# Patient Record
Sex: Female | Born: 1975
Health system: Southern US, Community
[De-identification: ages and names within clinical notes are randomized; demographics above are authoritative.]

## PROBLEM LIST (undated history)

## (undated) DIAGNOSIS — J45909 Unspecified asthma, uncomplicated: Secondary | ICD-10-CM

## (undated) DIAGNOSIS — E039 Hypothyroidism, unspecified: Secondary | ICD-10-CM

---

## 2018-09-06 ENCOUNTER — Ambulatory Visit
Admission: RE | Admit: 2018-09-06 | Discharge: 2018-09-06 | Disposition: A | Discharge status: Home / Self Care | Payer: 59 | Source: Ambulatory Visit | Attending: Family Medicine | Admitting: Family Medicine

## 2018-09-06 ENCOUNTER — Other Ambulatory Visit: Payer: Self-pay | Admitting: Family Medicine

## 2018-09-06 DIAGNOSIS — J189 Pneumonia, unspecified organism: Secondary | ICD-10-CM

## 2018-10-10 ENCOUNTER — Emergency Department (HOSPITAL_COMMUNITY): Payer: 59

## 2018-10-10 ENCOUNTER — Encounter (HOSPITAL_COMMUNITY): Payer: Self-pay | Admitting: *Deleted

## 2018-10-10 ENCOUNTER — Emergency Department (HOSPITAL_COMMUNITY)
Admission: EM | Admit: 2018-10-10 | Discharge: 2018-10-10 | Disposition: A | Discharge status: Home / Self Care | Payer: 59 | Attending: Emergency Medicine | Admitting: Emergency Medicine

## 2018-10-10 DIAGNOSIS — J45909 Unspecified asthma, uncomplicated: Secondary | ICD-10-CM | POA: Insufficient documentation

## 2018-10-10 DIAGNOSIS — R519 Headache, unspecified: Secondary | ICD-10-CM

## 2018-10-10 DIAGNOSIS — E039 Hypothyroidism, unspecified: Secondary | ICD-10-CM | POA: Insufficient documentation

## 2018-10-10 DIAGNOSIS — F17210 Nicotine dependence, cigarettes, uncomplicated: Secondary | ICD-10-CM | POA: Diagnosis not present

## 2018-10-10 DIAGNOSIS — R51 Headache: Secondary | ICD-10-CM | POA: Diagnosis not present

## 2018-10-10 HISTORY — DX: Unspecified asthma, uncomplicated: J45.909

## 2018-10-10 HISTORY — DX: Hypothyroidism, unspecified: E03.9

## 2018-10-10 LAB — CBC WITH DIFFERENTIAL/PLATELET
Abs Immature Granulocytes: 0.03 10*3/uL (ref 0.00–0.07)
Basophils Absolute: 0.1 10*3/uL (ref 0.0–0.1)
Basophils Relative: 1 %
Eosinophils Absolute: 0.4 10*3/uL (ref 0.0–0.5)
Eosinophils Relative: 4 %
HEMATOCRIT: 41.4 % (ref 36.0–46.0)
HEMOGLOBIN: 13.6 g/dL (ref 12.0–15.0)
IMMATURE GRANULOCYTES: 0 %
LYMPHS ABS: 1.8 10*3/uL (ref 0.7–4.0)
LYMPHS PCT: 19 %
MCH: 29.4 pg (ref 26.0–34.0)
MCHC: 32.9 g/dL (ref 30.0–36.0)
MCV: 89.6 fL (ref 80.0–100.0)
Monocytes Absolute: 0.4 10*3/uL (ref 0.1–1.0)
Monocytes Relative: 4 %
NEUTROS PCT: 72 %
NRBC: 0 % (ref 0.0–0.2)
Neutro Abs: 6.6 10*3/uL (ref 1.7–7.7)
Platelets: 293 10*3/uL (ref 150–400)
RBC: 4.62 MIL/uL (ref 3.87–5.11)
RDW: 13.1 % (ref 11.5–15.5)
WBC: 9.2 10*3/uL (ref 4.0–10.5)

## 2018-10-10 LAB — COMPREHENSIVE METABOLIC PANEL
ALK PHOS: 55 U/L (ref 38–126)
ALT: 21 U/L (ref 0–44)
ANION GAP: 8 (ref 5–15)
AST: 18 U/L (ref 15–41)
Albumin: 3.7 g/dL (ref 3.5–5.0)
BILIRUBIN TOTAL: 0.9 mg/dL (ref 0.3–1.2)
BUN: 10 mg/dL (ref 6–20)
CO2: 23 mmol/L (ref 22–32)
Calcium: 9 mg/dL (ref 8.9–10.3)
Chloride: 106 mmol/L (ref 98–111)
Creatinine, Ser: 0.8 mg/dL (ref 0.44–1.00)
GFR calc Af Amer: >60 mL/min (ref >60)
GFR calc non Af Amer: >60 mL/min (ref >60)
GLUCOSE: 85 mg/dL (ref 70–99)
POTASSIUM: 3.7 mmol/L (ref 3.5–5.1)
SODIUM: 137 mmol/L (ref 135–145)
TOTAL PROTEIN: 7.2 g/dL (ref 6.5–8.1)

## 2018-10-10 LAB — I-STAT BETA HCG BLOOD, ED (MC, WL, AP ONLY): I-stat hCG, quantitative: <5 m[IU]/mL (ref <5)

## 2018-10-10 MED ORDER — KETOROLAC TROMETHAMINE 15 MG/ML IJ SOLN
15.0000 mg | Freq: Once | INTRAMUSCULAR | Status: AC
  Administered 2018-10-10: 15 mg via INTRAVENOUS
  Filled 2018-10-10: qty 1

## 2018-10-10 MED ORDER — METOCLOPRAMIDE HCL 5 MG/ML IJ SOLN
10.0000 mg | Freq: Once | INTRAMUSCULAR | Status: AC
  Administered 2018-10-10: 10 mg via INTRAVENOUS
  Filled 2018-10-10: qty 2

## 2018-10-10 MED ORDER — IOPAMIDOL (ISOVUE-370) INJECTION 76%
75.0000 mL | Freq: Once | INTRAVENOUS | Status: AC | PRN
  Administered 2018-10-10: 75 mL via INTRAVENOUS

## 2018-10-10 MED ORDER — SODIUM CHLORIDE 0.9 % IV BOLUS
500.0000 mL | Freq: Once | INTRAVENOUS | Status: AC
  Administered 2018-10-10: 500 mL via INTRAVENOUS

## 2018-10-10 MED ORDER — DEXAMETHASONE SODIUM PHOSPHATE 10 MG/ML IJ SOLN
10.0000 mg | Freq: Once | INTRAMUSCULAR | Status: AC
  Administered 2018-10-10: 10 mg via INTRAVENOUS
  Filled 2018-10-10: qty 1

## 2018-10-10 MED ORDER — IOPAMIDOL (ISOVUE-370) INJECTION 76%
INTRAVENOUS | Status: AC
  Filled 2018-10-10: qty 100

## 2018-10-10 NOTE — ED Provider Notes (Signed)
[facility]   CSN: [id number] Arrival date & time: 10/10/18  1357     History   Chief Complaint Chief Complaint  Patient presents with  . Headache    HPI Caroline Murphy is a 42 y.o. female presented for evaluation of headache.  Patient states for the past 3 days, she has been having a persistent headache.  Symptoms are worse with exertion, such as when she goes from sitting to standing or walking to her car.  Headache encompasses her entire head.  When it is very severe, she states she feels like her vision changes.  She has been taking Excedrin Migraine Aleve and Advil without improvement of symptoms.  She reports a history of migraines, but states this feels more severe and different from her normal migraines.  She denies vision changes currently.  She denies fall, trauma, or injury.  She has fevers, chills, chest pain, neck stiffness, cough, shortness of breath, abdominal pain, urinary symptoms, abnormal bowel movements.  She has a history of hypothyroidism and asthma which is controlled with medication.  She is trying to decrease her cigarette use, currently smoking 6-7 cigarettes a day. Denies etoh or drug use.   HPI  Past Medical History:  Diagnosis Date  . Asthma   . Hypothyroid     There are no active problems to display for this patient.   History reviewed. No pertinent surgical history.   OB History   None      Home Medications    Prior to Admission medications   Not on File    Family History History reviewed. No pertinent family history.  Social History Social History   Tobacco Use  . Smoking status: Current Every Day Smoker  . Smokeless tobacco: Never Used  Substance Use Topics  . Alcohol use: Not on file  . Drug use: Not on file     Allergies   Patient has no known allergies.   Review of Systems Review of Systems  Neurological: Positive for headaches.  All other systems reviewed and are  negative.    Physical Exam Updated Vital Signs BP 111/78   Pulse 88   Temp 98.6 F (37 C) (Oral)   Resp 18   LMP 08/10/2018 (Approximate)   SpO2 97%   Physical Exam  Constitutional: She is oriented to person, place, and time. She appears well-developed and well-nourished. No distress.  Young female in NAD  HENT:  Head: Normocephalic and atraumatic.  OP clear without tonsillar swelling or exudate.  Uvula midline with equal palate rise.  TMs nonerythematous and nonbulging bilaterally.  Eyes: Pupils are equal, round, and reactive to light. Conjunctivae and EOM are normal.  EOMI and PERRLA.  No nystagmus.  Neck: Normal range of motion. Neck supple.  Cardiovascular: Normal rate, regular rhythm and intact distal pulses.  Pulmonary/Chest: Effort normal and breath sounds normal. No respiratory distress. She has no wheezes.  Abdominal: Soft. She exhibits no distension and no mass. There is no tenderness. There is no guarding.  Musculoskeletal: Normal range of motion.  Neurological: She is alert and oriented to person, place, and time. No cranial nerve deficit or sensory deficit. Coordination normal.  No obvious neurologic deficits.  CN intact.  Fine movement and coordination intact.  Strength intact x4.  Sensation intact x4.  Skin: Skin is warm and dry. Capillary refill takes less than 2 seconds.  Psychiatric: She has a normal mood and affect.  Nursing note and vitals reviewed.  ED Treatments / Results  Labs (all labs ordered are listed, but only abnormal results are displayed) Labs Reviewed  CBC WITH DIFFERENTIAL/PLATELET  COMPREHENSIVE METABOLIC PANEL  I-STAT BETA HCG BLOOD, ED ([facility], [facility], [facility] ONLY)    EKG None  Radiology Ct Angio Head W/cm &/or Wo Cm  Result Date: 10/10/2018 CLINICAL DATA:  42 y/o  F; worst headache of life for days. EXAM: CT ANGIOGRAPHY HEAD TECHNIQUE: Multidetector CT imaging of the head was performed using the standard protocol during bolus  administration of intravenous contrast. Multiplanar CT image reconstructions and MIPs were obtained to evaluate the vascular anatomy. CONTRAST:  75mL ISOVUE-370 IOPAMIDOL (ISOVUE-370) INJECTION 76% COMPARISON:  10/10/2018 CT head. FINDINGS: CT HEAD Brain: No evidence of acute infarction, hemorrhage, hydrocephalus, extra-axial collection or mass lesion/mass effect. Vascular: As below. Skull: Normal. Negative for fracture or focal lesion. Sinuses: Right anterior nasal passage 17 mm polyp. Right maxillary sinus small mucous retention cyst. Mucosal thickening of anterior ethmoid air cells. Normal aeration of mastoid air cells. Orbits: No acute finding. CTA HEAD Anterior circulation: No significant stenosis, proximal occlusion, aneurysm, or vascular malformation. Posterior circulation: No significant stenosis, proximal occlusion, aneurysm, or vascular malformation. Venous sinuses: As permitted by contrast timing, patent. Anatomic variants: Anterior and right posterior communicating arteries. Delayed phase: No abnormal intracranial enhancement. IMPRESSION: 1. No acute intracranial abnormality or abnormal enhancement of the brain. 2. No significant stenosis, proximal occlusion, aneurysm, or vascular malformation of the Circle of Willis. 3. Right anterior nasal passage polyp. Electronically Signed   By: Mitzi Hansen M.D.   On: 10/10/2018 18:18   Ct Head Wo Contrast  Result Date: 10/10/2018 CLINICAL DATA:  Headache EXAM: CT HEAD WITHOUT CONTRAST TECHNIQUE: Contiguous axial images were obtained from the base of the skull through the vertex without intravenous contrast. COMPARISON:  None. FINDINGS: Brain: No evidence of acute infarction, hemorrhage, hydrocephalus, extra-axial collection or mass lesion/mass effect. Vascular: No hyperdense vessel or unexpected calcification. Skull: Normal. Negative for fracture or focal lesion. Sinuses/Orbits: Retention cyst in the right maxillary sinus. Mucosal thickening in  the ethmoid sinuses. Other: None IMPRESSION: Negative non contrasted CT appearance of the brain. Electronically Signed   By: Jasmine Pang M.D.   On: 10/10/2018 15:35    Procedures Procedures (including critical care time)  Medications Ordered in ED Medications  iopamidol (ISOVUE-370) 76 % injection (has no administration in time range)  ketorolac (TORADOL) 15 MG/ML injection 15 mg (15 mg Intravenous Given 10/10/18 1623)  metoCLOPramide (REGLAN) injection 10 mg (10 mg Intravenous Given 10/10/18 1623)  dexamethasone (DECADRON) injection 10 mg (10 mg Intravenous Given 10/10/18 1622)  sodium chloride 0.9 % bolus 500 mL (0 mLs Intravenous Stopped 10/10/18 1730)  iopamidol (ISOVUE-370) 76 % injection 75 mL (75 mLs Intravenous Contrast Given 10/10/18 1734)     Initial Impression / Assessment and Plan / ED Course  I have reviewed the triage vital signs and the nursing notes.  Pertinent labs & imaging results that were available during my care of the patient were reviewed by me and considered in my medical decision making (see chart for details).     Presenting for evaluation of headache.  Initial exam reassuring, no obvious neurologic deficits.  However, history concerning, symptoms are worsened with exertion.  CT head negative for acute bleed.  Will obtain labs and CTA head.  Headache cocktail given.  Labs reassuring.  Patient without fevers or chills.  No leukocytosis.  No neck stiffness.  Doubt meningitis.  Symptoms wax and wane, doubt CVA,  ICH, SAH. CTA head negative.  Assessment, patient reports headache is completely resolved.  Patient ambulated about return of headache or worsening symptoms.  Remains neurologically stable.  Discussed with attending, Dr. Ranae Palms agrees to plan. Discussed findings with patient.  Discussed continued treatment with Tylenol and ibuprofen as needed.  Follow-up with PCP as needed for further evaluation of her headaches.  At this time, patient appears safe for  discharge.  Return precautions given.  Patient states she understands and agrees to plan.   Final Clinical Impressions(s) / ED Diagnoses   Final diagnoses:  Acute nonintractable headache, unspecified headache type    ED Discharge Orders    None       Alveria Apley, PA-C 10/10/18 2004    Loren Racer, MD 10/10/18 2309

## 2018-10-10 NOTE — Discharge Instructions (Addendum)
Continue taking home medications as prescribed. Make sure you stay well-hydrated water. Use Tylenol or ibuprofen as needed for further headaches. Return to the emergency room if you develop persistent headache despite medication, vision change, slurred speech, numbness/tingling of the arms/legs, or any new or concerning symptoms.

## 2018-10-10 NOTE — ED Notes (Signed)
Vital signs charted in error

## 2018-10-10 NOTE — ED Notes (Signed)
Patient transported to CT 

## 2018-10-10 NOTE — ED Notes (Signed)
Patient verbalizes understanding of discharge instructions. Opportunity for questioning and answers were provided. Armband removed by staff, pt discharged from ED.  

## 2018-10-10 NOTE — ED Triage Notes (Signed)
Pt in c/o headache that is worse with activity and exertion for the last two days, states this is the worst headache of her life and feels like there is a pulsation inside of her head, history of migraines but states this does not feel the same

## 2019-10-27 IMAGING — CT CT ANGIO HEAD
3 of 11 series · 18 of 47 positions shown · IV contrast (APPLIED)
Comparison: 10/10/2018 CT head.

CLINICAL DATA: 42 y/o  F; worst headache of life for days.

EXAM:
CT ANGIOGRAPHY HEAD
TECHNIQUE: Multidetector CT imaging of the head was performed using the
standard protocol during bolus administration of intravenous
contrast. Multiplanar CT image reconstructions and MIPs were
obtained to evaluate the vascular anatomy.
CONTRAST:  75mL ZIUOKU-C14 IOPAMIDOL (ZIUOKU-C14) INJECTION 76%

[Series 9: thin axial · axial · 0.43mm/px · z∈[-221,-84]mm · 12 of 163 slices shown]
[im 13/163  brain]
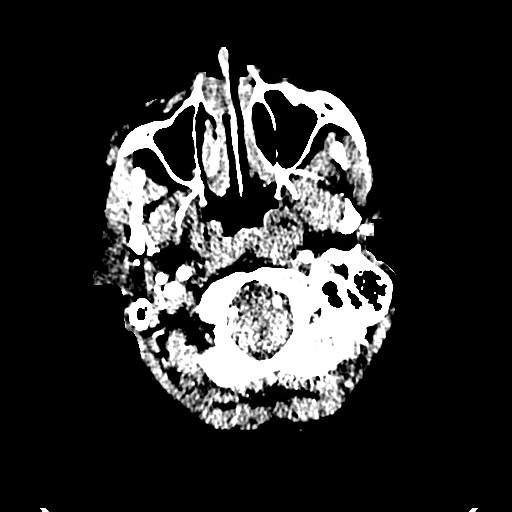
[im 25/163  bone]
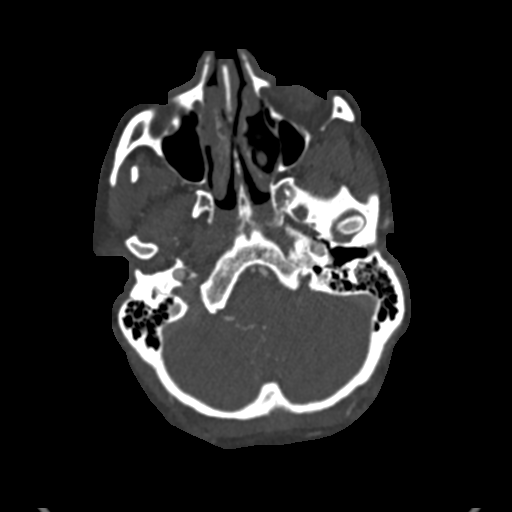
[im 38/163  brain]
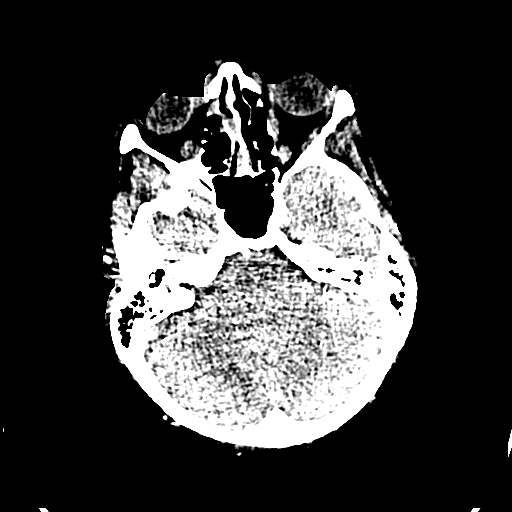
[im 50/163  bone]
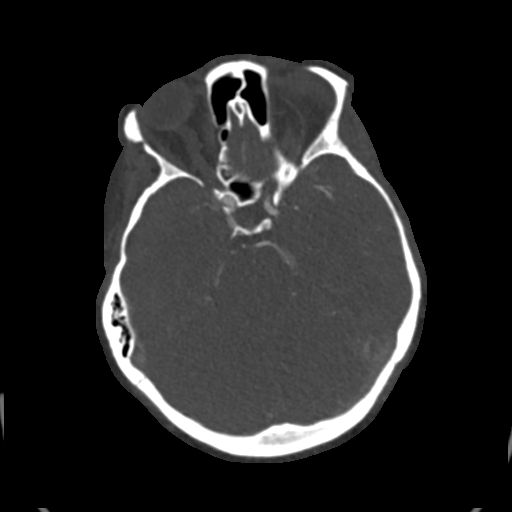
[im 63/163  brain]
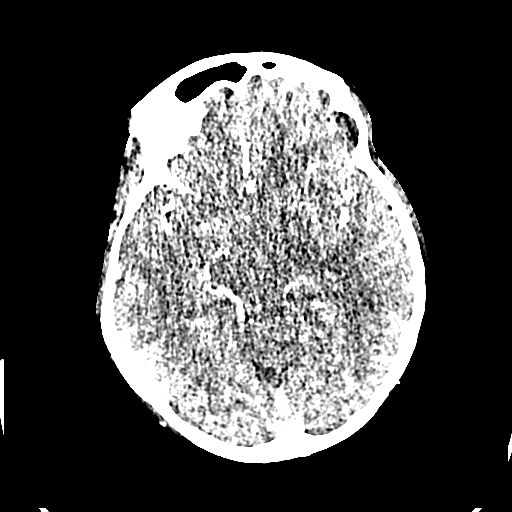
[im 75/163  bone]
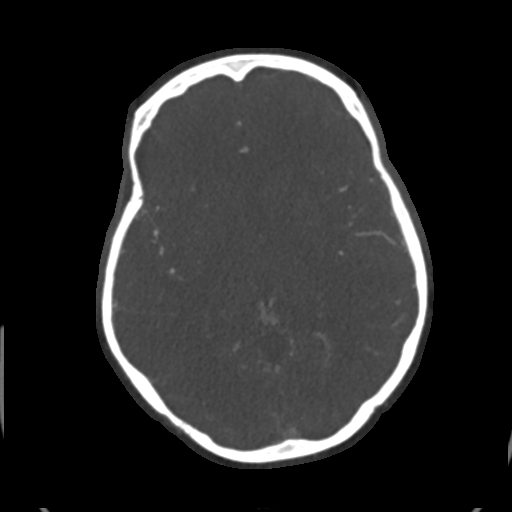
[im 88/163  brain]
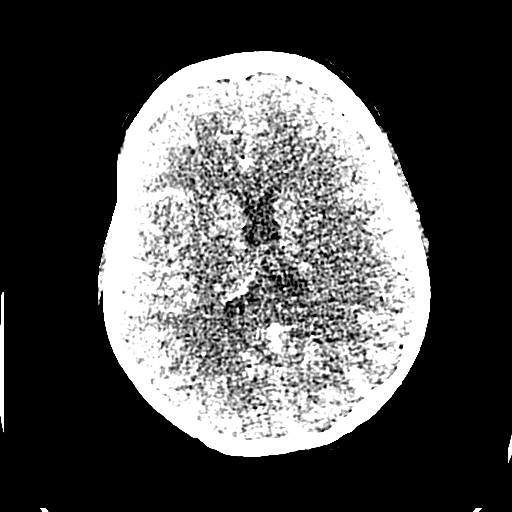
[im 100/163  bone]
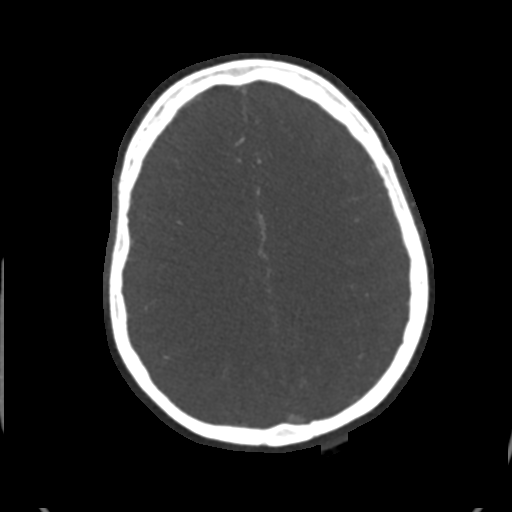
[im 113/163  brain]
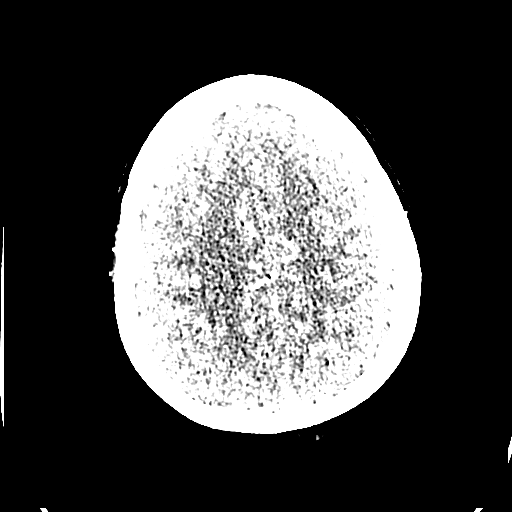
[im 125/163  bone]
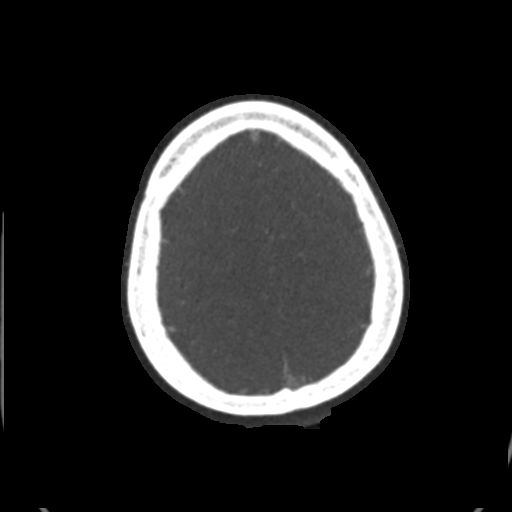
[im 138/163  brain]
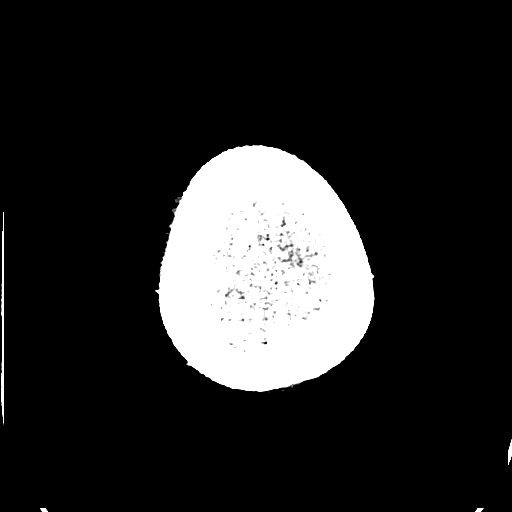
[im 150/163  bone]
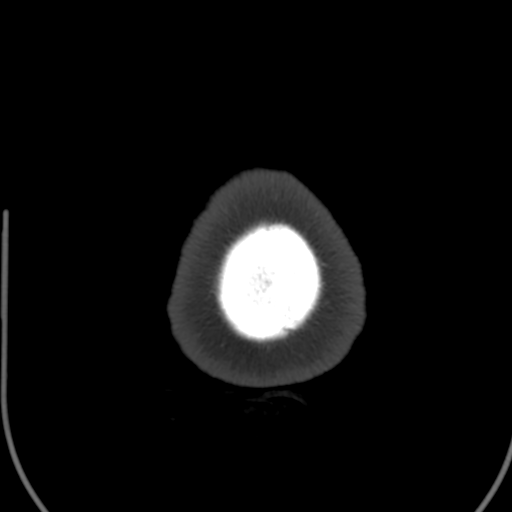

[Series 10: coronal thin · coronal · 0.33mm/px · 3 of 214 slices shown]
[im 61/214  brain]
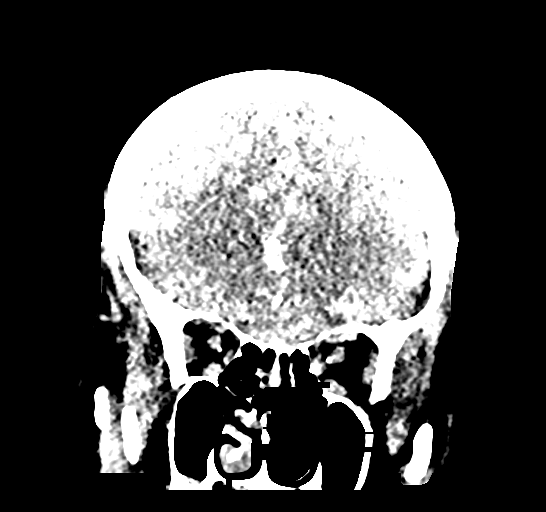
[im 92/214  brain]
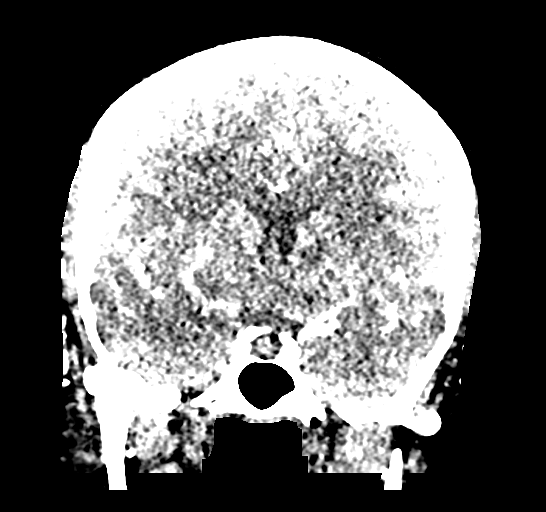
[im 122/214  brain]
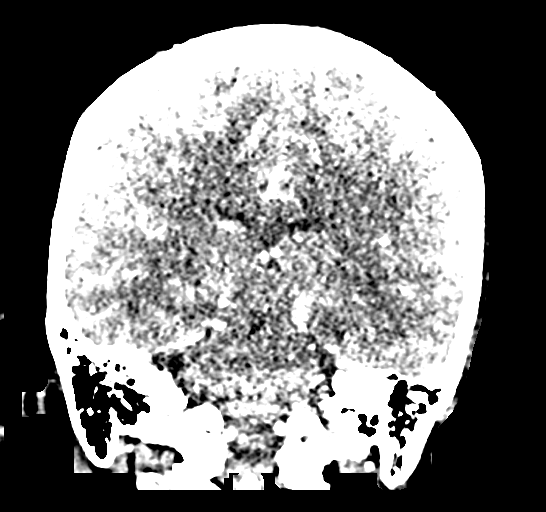

[Series 12: sagittal thin · sagittal · 0.33mm/px · 3 of 173 slices shown]
[im 35/173  brain]
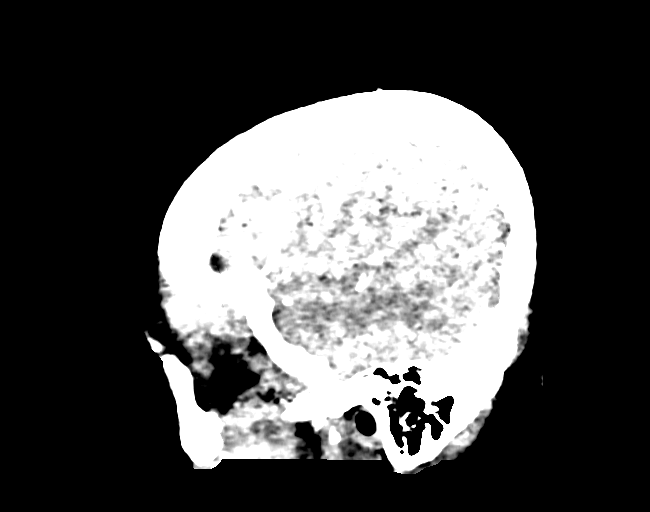
[im 69/173  brain]
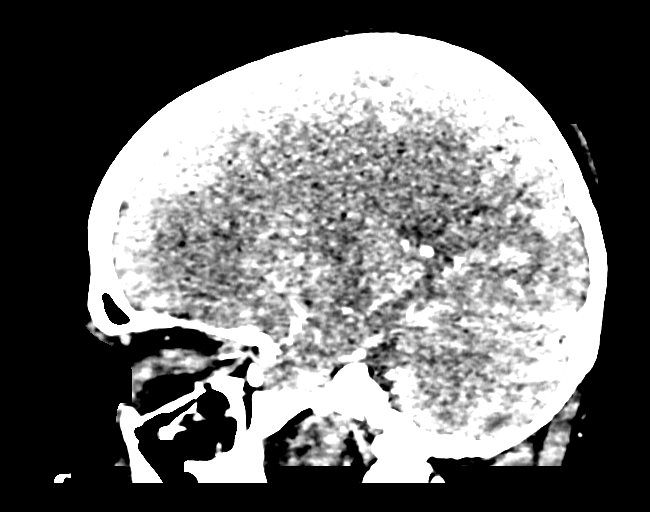
[im 104/173  brain]
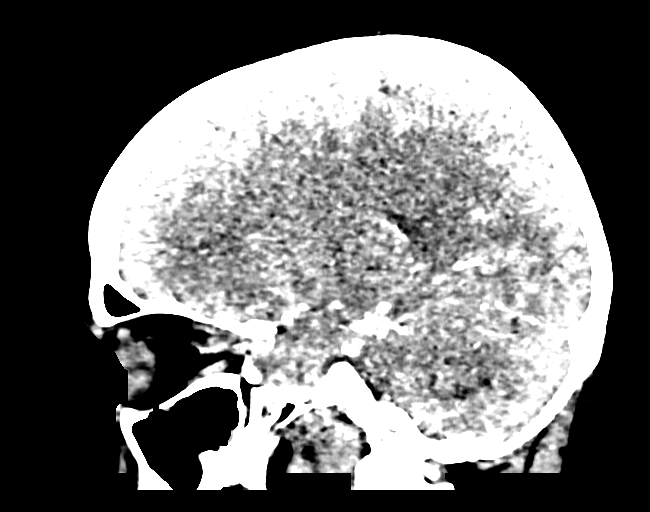

[18 of 47 positions shown; findings below may reference images not displayed]

FINDINGS: CT HEAD

Brain: No evidence of acute infarction, hemorrhage, hydrocephalus,
extra-axial collection or mass lesion/mass effect.

Vascular: As below.

Skull: Normal. Negative for fracture or focal lesion.

Sinuses: Right anterior nasal passage 17 mm polyp. Right maxillary
sinus small mucous retention cyst. Mucosal thickening of anterior
ethmoid air cells. Normal aeration of mastoid air cells.

Orbits: No acute finding.

CTA HEAD

Anterior circulation: No significant stenosis, proximal occlusion,
aneurysm, or vascular malformation.

Posterior circulation: No significant stenosis, proximal occlusion,
aneurysm, or vascular malformation.

Venous sinuses: As permitted by contrast timing, patent.

Anatomic variants: Anterior and right posterior communicating
arteries.

Delayed phase: No abnormal intracranial enhancement.
IMPRESSION: 1. No acute intracranial abnormality or abnormal enhancement of the
brain.
2. No significant stenosis, proximal occlusion, aneurysm, or
vascular malformation of the Circle of Willis.
3. Right anterior nasal passage polyp.

## 2019-10-27 IMAGING — CT CT HEAD W/O CM
4 series · 17 of 47 positions shown, 19 images · non-contrast
Comparison: None.

CLINICAL DATA: Headache

EXAM:
CT HEAD WITHOUT CONTRAST
TECHNIQUE: Contiguous axial images were obtained from the base of the skull
through the vertex without intravenous contrast.

[Series 3: head without · axial · non-contrast · 0.43mm/px · z∈[-105,+20]mm · 7 of 35 slices shown, 9 images]
[im 5/35  brain]
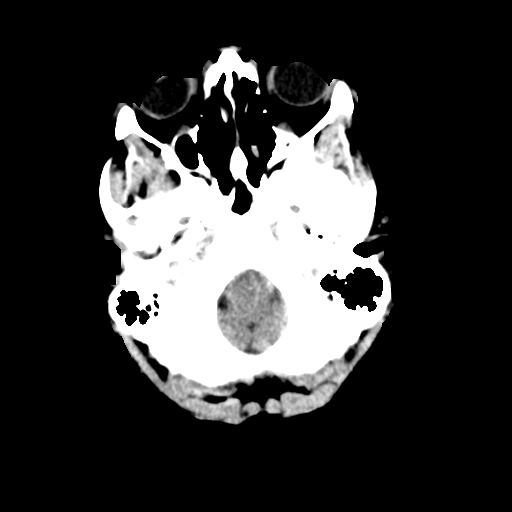
[im 5/35  bone]
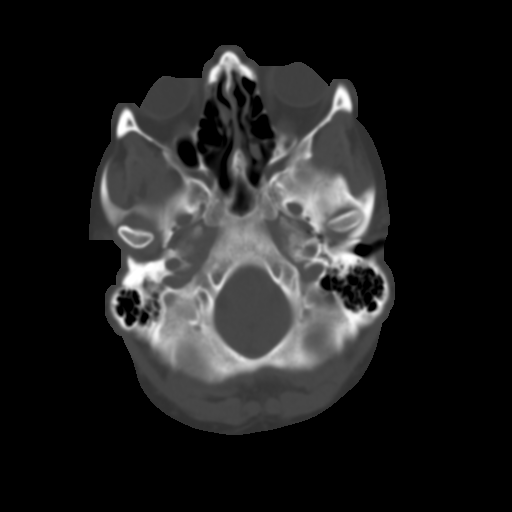
[im 9/35  brain]
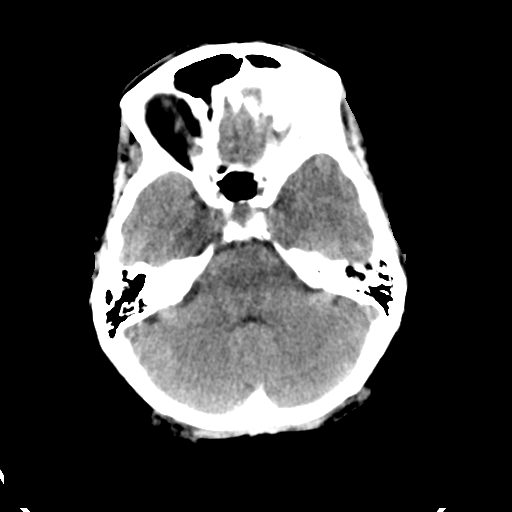
[im 13/35  brain]
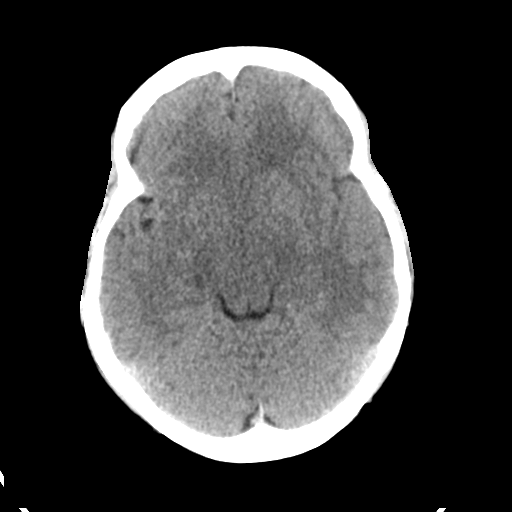
[im 18/35  brain]
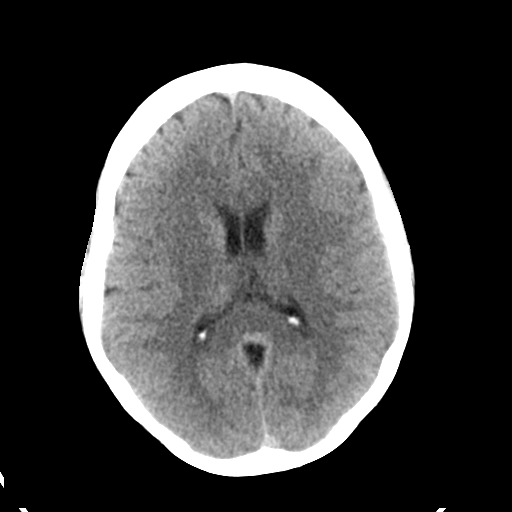
[im 22/35  brain]
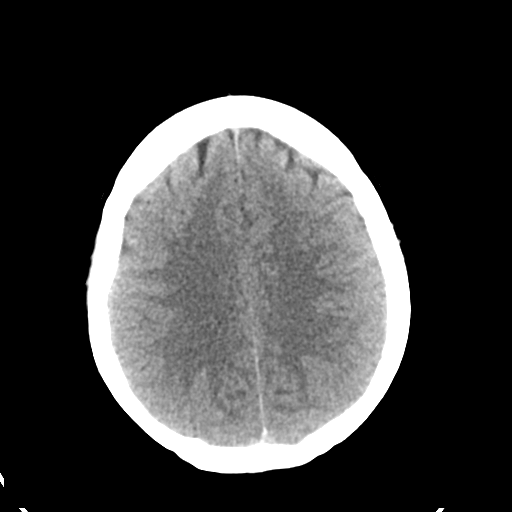
[im 22/35  bone]
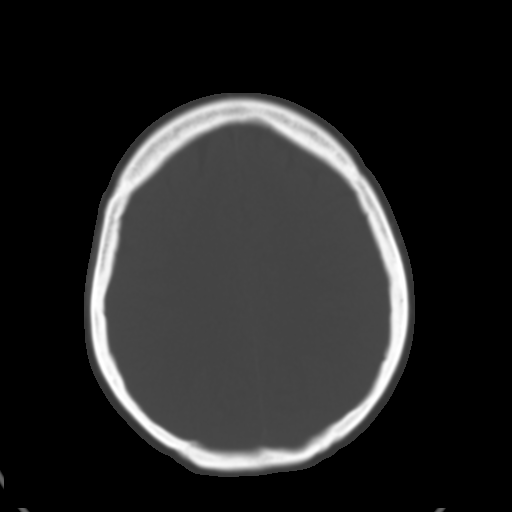
[im 26/35  brain]
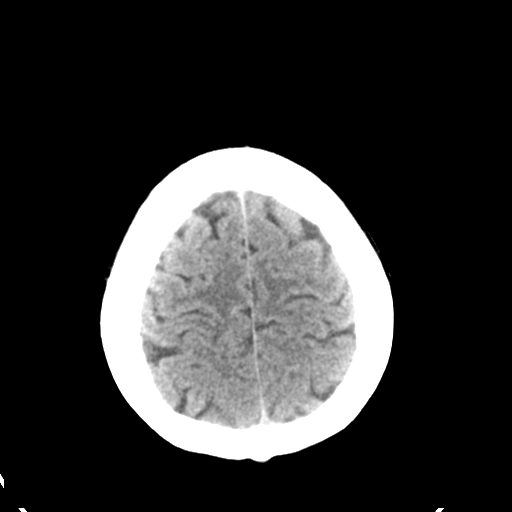
[im 30/35  brain]
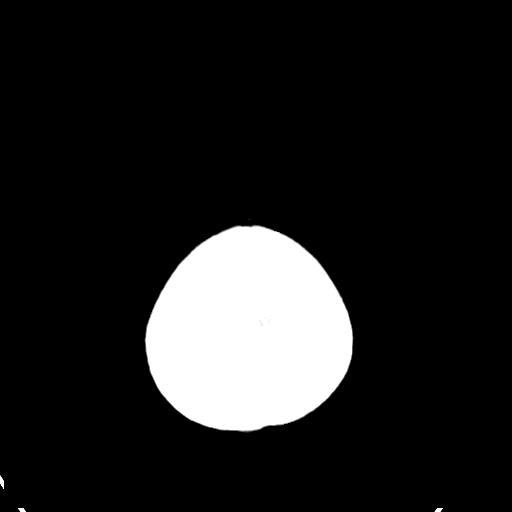

[Series 4: head bone · axial · 0.43mm/px · z∈[-109,-49]mm · 4 of 87 slices shown]
[im 9/87  bone]
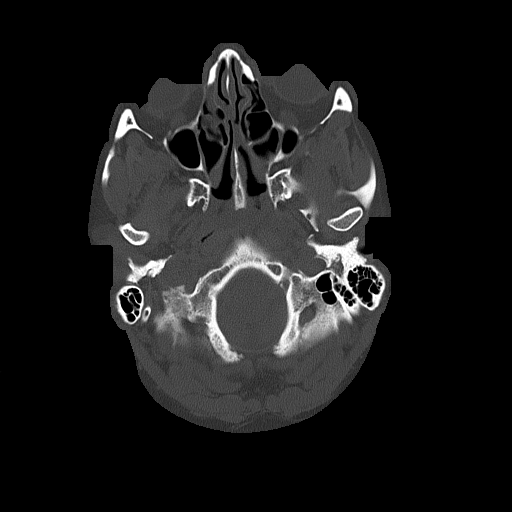
[im 18/87  bone]
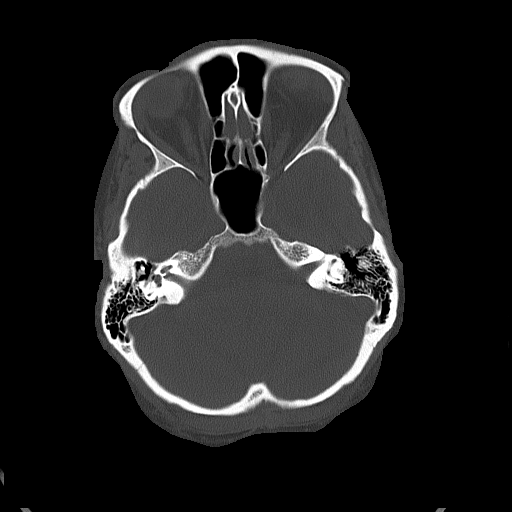
[im 26/87  bone]
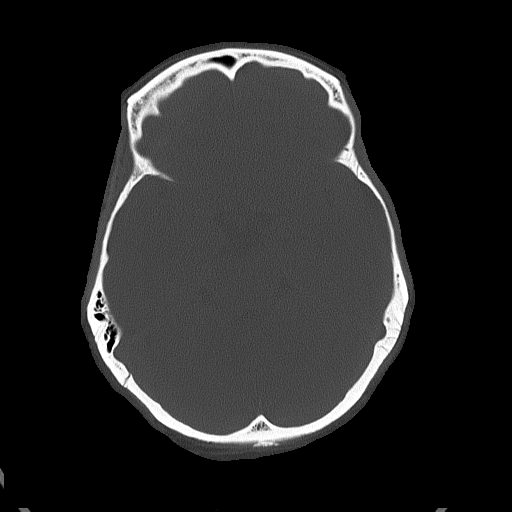
[im 39/87  bone]
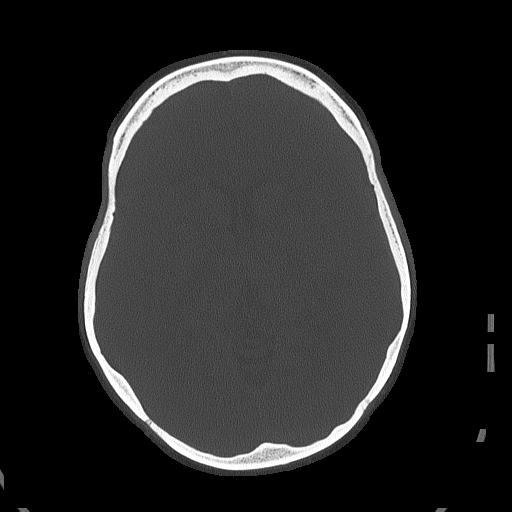

[Series 5: head without cor · coronal · non-contrast · 0.33mm/px · 3 of 67 slices shown]
[im 23/67  brain]
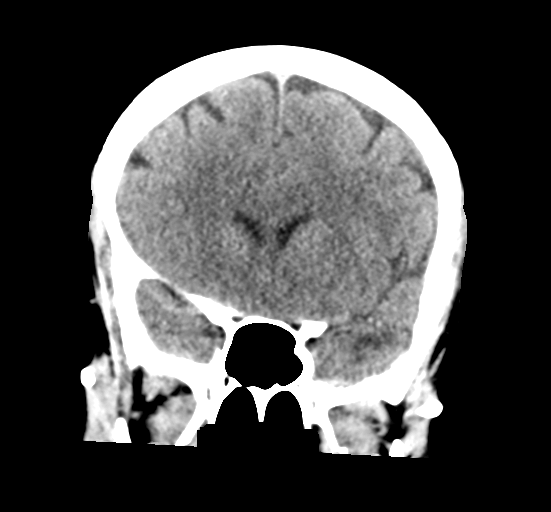
[im 30/67  brain]
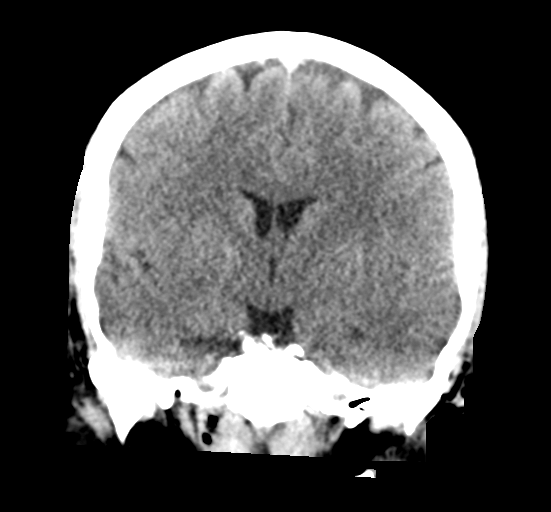
[im 37/67  brain]
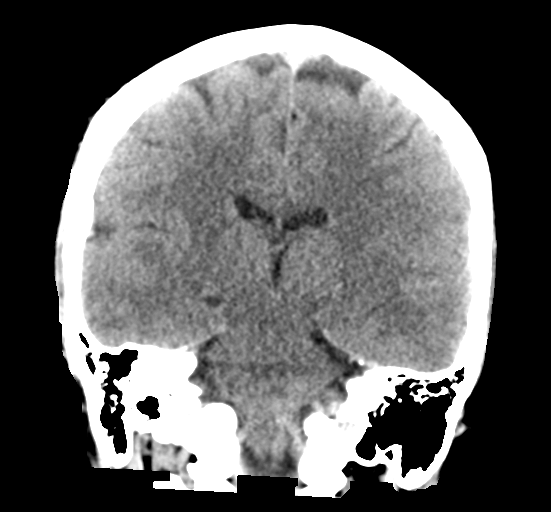

[Series 6: head without sag · sagittal · non-contrast · 0.33mm/px · 3 of 61 slices shown]
[im 21/61  brain]
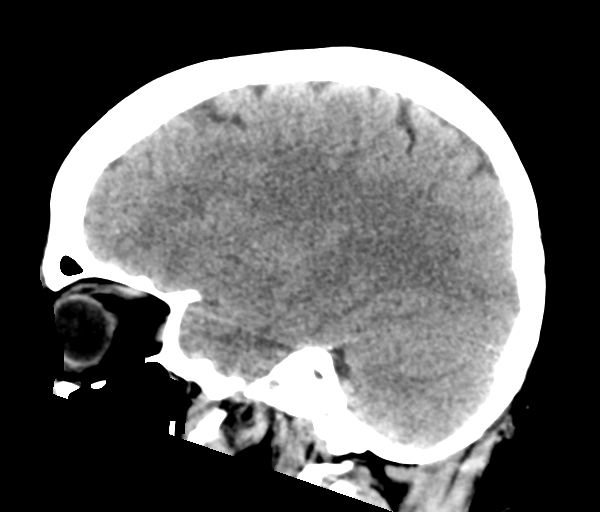
[im 31/61  brain]
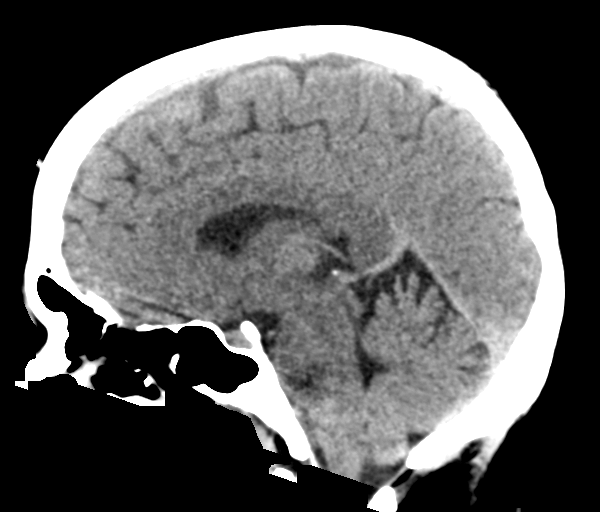
[im 41/61  brain]
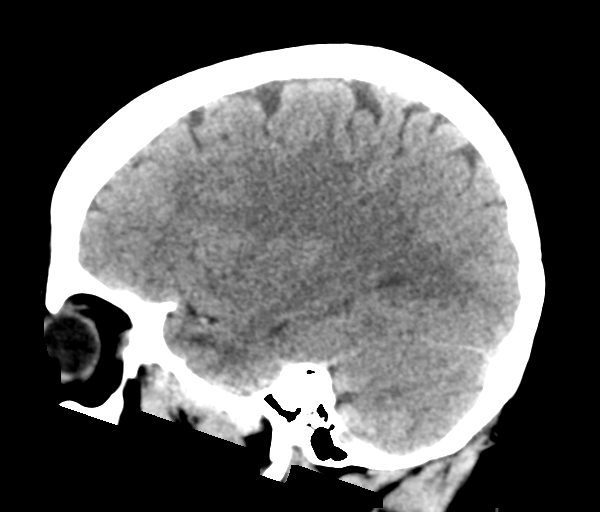

[17 of 47 positions shown; findings below may reference images not displayed]

FINDINGS: Brain: No evidence of acute infarction, hemorrhage, hydrocephalus,
extra-axial collection or mass lesion/mass effect.

Vascular: No hyperdense vessel or unexpected calcification.

Skull: Normal. Negative for fracture or focal lesion.

Sinuses/Orbits: Retention cyst in the right maxillary sinus. Mucosal
thickening in the ethmoid sinuses.

Other: None
IMPRESSION: Negative non contrasted CT appearance of the brain.

## 2020-11-10 ENCOUNTER — Other Ambulatory Visit: Payer: Self-pay | Admitting: Otolaryngology

## 2020-11-10 DIAGNOSIS — J320 Chronic maxillary sinusitis: Secondary | ICD-10-CM

## 2020-11-27 ENCOUNTER — Ambulatory Visit
Admission: RE | Admit: 2020-11-27 | Discharge: 2020-11-27 | Disposition: A | Discharge status: Home / Self Care | Payer: No Typology Code available for payment source | Source: Ambulatory Visit | Attending: Otolaryngology | Admitting: Otolaryngology

## 2020-11-27 DIAGNOSIS — J320 Chronic maxillary sinusitis: Secondary | ICD-10-CM

## 2021-09-10 ENCOUNTER — Emergency Department (HOSPITAL_COMMUNITY)
Admission: EM | Admit: 2021-09-10 | Discharge: 2021-09-10 | Discharge status: Against Medical Advice | Payer: No Typology Code available for payment source

## 2021-09-10 ENCOUNTER — Other Ambulatory Visit: Payer: Self-pay

## 2021-09-10 ENCOUNTER — Encounter (HOSPITAL_COMMUNITY): Payer: Self-pay

## 2021-09-10 ENCOUNTER — Emergency Department (HOSPITAL_COMMUNITY)
Admission: EM | Admit: 2021-09-10 | Discharge: 2021-09-11 | Disposition: A | Discharge status: Home / Self Care | Payer: No Typology Code available for payment source | Attending: Emergency Medicine | Admitting: Emergency Medicine

## 2021-09-10 DIAGNOSIS — H53149 Visual discomfort, unspecified: Secondary | ICD-10-CM | POA: Diagnosis not present

## 2021-09-10 DIAGNOSIS — G43009 Migraine without aura, not intractable, without status migrainosus: Secondary | ICD-10-CM

## 2021-09-10 DIAGNOSIS — R110 Nausea: Secondary | ICD-10-CM | POA: Diagnosis not present

## 2021-09-10 DIAGNOSIS — Z79899 Other long term (current) drug therapy: Secondary | ICD-10-CM | POA: Diagnosis not present

## 2021-09-10 DIAGNOSIS — F17200 Nicotine dependence, unspecified, uncomplicated: Secondary | ICD-10-CM | POA: Diagnosis not present

## 2021-09-10 DIAGNOSIS — E039 Hypothyroidism, unspecified: Secondary | ICD-10-CM | POA: Insufficient documentation

## 2021-09-10 DIAGNOSIS — R519 Headache, unspecified: Secondary | ICD-10-CM | POA: Diagnosis not present

## 2021-09-10 DIAGNOSIS — J45909 Unspecified asthma, uncomplicated: Secondary | ICD-10-CM | POA: Insufficient documentation

## 2021-09-10 NOTE — ED Provider Notes (Signed)
MSE note.  Patient states that couple days ago she has had some severe diarrhea and then has had severe headaches and again today she had some diarrhea with severe headaches no vomiting.  Her headache is improving.  Physical exam patient alert and mild distress oriented x4.  We will get a CT scan of her head and check her sugar.   Bethann Berkshire, MD 09/10/21 [phone]

## 2021-09-10 NOTE — ED Triage Notes (Signed)
Pt complains of headache and diarrhea for the past few days. Pt states that when she has a headache it normally does not feel like this. Pt states that she feels like she is over exerting herself.

## 2021-09-11 ENCOUNTER — Emergency Department (HOSPITAL_COMMUNITY): Payer: No Typology Code available for payment source

## 2021-09-11 MED ORDER — ONDANSETRON 4 MG PO TBDP: 4.0000 mg | ORAL_TABLET | Freq: Three times a day (TID) | ORAL | 0 refills | Status: AC | PRN

## 2021-09-11 MED ORDER — SODIUM CHLORIDE 0.9 % IV BOLUS
1000.0000 mL | Freq: Once | INTRAVENOUS | Status: AC
  Administered 2021-09-11: 1000 mL via INTRAVENOUS

## 2021-09-11 MED ORDER — DIPHENHYDRAMINE HCL 50 MG/ML IJ SOLN
50.0000 mg | Freq: Once | INTRAMUSCULAR | Status: AC
  Administered 2021-09-11: 50 mg via INTRAVENOUS
  Filled 2021-09-11: qty 1

## 2021-09-11 MED ORDER — METOCLOPRAMIDE HCL 5 MG/ML IJ SOLN
10.0000 mg | Freq: Once | INTRAMUSCULAR | Status: AC
  Administered 2021-09-11: 10 mg via INTRAVENOUS
  Filled 2021-09-11: qty 2

## 2021-09-11 MED ORDER — KETOROLAC TROMETHAMINE 30 MG/ML IJ SOLN
30.0000 mg | Freq: Once | INTRAMUSCULAR | Status: AC
  Administered 2021-09-11: 30 mg via INTRAMUSCULAR
  Filled 2021-09-11: qty 1

## 2021-09-11 NOTE — ED Provider Notes (Signed)
[facility]-EMERGENCY DEPT Provider Note   CSN: [id number] Arrival date & time: 09/10/21  2306     History Chief Complaint  Patient presents with   Headache   Diarrhea    Caroline Murphy is a 45 y.o. female.  HPI  Patient with history of hypothyroid and asthma presents with headache.  He does have a history of headaches, the last few weeks they have become more frequent and more intense in nature.  States the headaches are constant, they are worsened by stress.  She denies any recent changes in her personal life or work life, but she does take care of her autistic son which is a stressor.  Headaches are associated with nausea and photophobia, no vomiting.  They are in a bandlike distribution across her forehead primarily.  Patient also reports she is having more episodes of loose stool.  Endorses diarrhea, denies any bloody stool.  Denies abdominal pain.  No fevers at home, happened after she ate at a restaurant.  Nobody else at the restaurant got sick.  Denies any vomiting or abdominal pains or after the episode.  Diarrhea appears to have resolved in the last 2 days.  Denies any recent changes to her levothyroxine.  Past Medical History:  Diagnosis Date   Asthma    Hypothyroid     There are no problems to display for this patient.   History reviewed. No pertinent surgical history.   OB History   No obstetric history on file.     History reviewed. No pertinent family history.  Social History   Tobacco Use   Smoking status: Every Day   Smokeless tobacco: Never    Home Medications Prior to Admission medications   Medication Sig Start Date End Date Taking? Authorizing Provider  levothyroxine (SYNTHROID) 75 MCG tablet Take 75 mcg by mouth daily. 07/28/21   [provider]  VENTOLIN HFA 108 (90 Base) MCG/ACT inhaler Inhale 2 puffs into the lungs every 4 (four) hours as needed for wheezing or shortness of breath. 09/02/21   [provider]    Allergies    Patient has no known allergies.  Review of Systems   Review of Systems  Constitutional:  Negative for fever.  Eyes:  Positive for photophobia. Negative for visual disturbance.  Gastrointestinal:  Positive for nausea. Negative for vomiting.  Musculoskeletal:  Negative for neck stiffness.  Neurological:  Positive for headaches. Negative for dizziness, seizures, syncope, weakness and numbness.  Psychiatric/Behavioral:  Negative for confusion.    Physical Exam Updated Vital Signs BP 115/68   Pulse 65   Temp 98.2 F (36.8 C) (Oral)   Resp 16   Ht 5\' 4"  (1.626 m)   Wt 83.9 kg   SpO2 100%   BMI 31.76 kg/m   Physical Exam Vitals and nursing note reviewed.  Constitutional:      General: She is not in acute distress.    Appearance: She is well-developed.  HENT:     Head: Normocephalic and atraumatic.  Eyes:     Extraocular Movements: Extraocular movements intact.     Right eye: No nystagmus.     Left eye: No nystagmus.     Conjunctiva/sclera: Conjunctivae normal.     Pupils: Pupils are equal, round, and reactive to light.     Comments: EOMI, no nystagmus.  Cardiovascular:     Rate and Rhythm: Normal rate and regular rhythm.     Heart sounds: No murmur heard. Pulmonary:     Effort:  Pulmonary effort is normal. No respiratory distress.     Breath sounds: Normal breath sounds.  Abdominal:     Palpations: Abdomen is soft.     Tenderness: There is no abdominal tenderness.  Musculoskeletal:     Cervical back: Neck supple.  Skin:    General: Skin is warm and dry.  Neurological:     Mental Status: She is alert and oriented to person, place, and time.     Cranial Nerves: No cranial nerve deficit.     Comments: Oriented x3.  No dysarthria, follows commands and answers questions appropriately.  Cranial nerves III through XII are grossly intact, sensation to light touch is grossly intact to the upper and lower extremities bilaterally.  Grip strength equal  bilaterally, plantar flexion and dorsiflexion 5/5 against resistance.  Patient is able to raise both legs.    ED Results / Procedures / Treatments   Labs (all labs ordered are listed, but only abnormal results are displayed) Labs Reviewed  CBG MONITORING, ED    EKG None  Radiology CT Head Wo Contrast  Result Date: 09/11/2021 CLINICAL DATA:  Hypertension, headache EXAM: CT HEAD WITHOUT CONTRAST TECHNIQUE: Contiguous axial images were obtained from the base of the skull through the vertex without intravenous contrast. COMPARISON:  10/10/2018 FINDINGS: Brain: No evidence of acute infarction, hemorrhage, hydrocephalus, extra-axial collection or mass lesion/mass effect. Vascular: No hyperdense vessel or unexpected calcification. Skull: Normal. Negative for fracture or focal lesion. Sinuses/Orbits: The visualized paranasal sinuses are essentially clear. The mastoid air cells are unopacified. Other: None. IMPRESSION: Normal head CT. Electronically Signed   By: Charline Bills M.D.   On: 09/11/2021 01:26    Procedures Procedures   Medications Ordered in ED Medications - No data to display  ED Course  I have reviewed the triage vital signs and the nursing notes.  Pertinent labs & imaging results that were available during my care of the patient were reviewed by me and considered in my medical decision making (see chart for details).    MDM Rules/Calculators/A&P                           Vitals are stable, patient is nontoxic-appearing.  No focal deficits on neuro exam, she is very neurologically intact.  No nystagmus on exam, no trauma to the head.  She is complaining about worsening frequency and duration and intensity of her symptoms, will proceed with CT head.  Patient has been keeping down oral fluids, her vitals are stable and she does not appear dry or dehydrated.  Given that the diarrhea appears to be primarily resolved in the last 2 days I do not think adding labs would be  particularly beneficial in her work-up.    Work-up reassuring.  CT head is without findings of SAH, patient's blood pressure is within normal limits.  No additional findings concerning for mass on CT imaging.  I discussed the results with the patient and supportive care.  Headache presentation seems like a mixed migraine versus tension versus alternative category.  Headache is in a bandlike distribution but it is associated with nausea, and photophobia.  And worsened by stress.  Overall, I do not suspect emergent pathology at this time.  Have advised her to follow-up with neurology for additional work-up.  She voiced understanding and agreement with plan, patient was discharged in stable position.  Final Clinical Impression(s) / ED Diagnoses Final diagnoses:  None    Rx / DC  Orders ED Discharge Orders     None        Theron Arista, [place] 09/11/21 1512    Derwood Kaplan, MD 09/12/21 [phone]

## 2021-09-11 NOTE — Discharge Instructions (Addendum)
Please call the [facility] to schedule follow-up evaluation for migraines. Trying to keep a diary of when he started having the migraines possible triggers.  Write down what you are doing, food you are eating, what time of day it is occurring at.  Bring this notebook with you when you see the neurologist. If you feel nauseated you can take the Zofran as needed.  Dissolves under your tongue, take it every 8 hours.  Return to the ED if things change or worsen.  You can take 800 mg of ibuprofen up to 3 times daily as needed for pain.  He can alternate between ibuprofen and Tylenol that also works. You can also try excedrin and other over the counter medicines.  Be mindful that anti-inflammatory medicine can increase the risk of getting gastric ulcers always take it with food and water, stop taking it if you start having abdominal pain.

## 2021-09-11 NOTE — ED Notes (Signed)
Pt reports improvement in her headache.
